# Patient Record
Sex: Female | Born: 1990 | Race: White | Hispanic: No | Marital: Single | Smoking: Never smoker
Health system: Southern US, Community
[De-identification: ages and names within clinical notes are randomized; demographics above are authoritative.]

## PROBLEM LIST (undated history)

## (undated) DIAGNOSIS — F419 Anxiety disorder, unspecified: Secondary | ICD-10-CM

## (undated) DIAGNOSIS — G43909 Migraine, unspecified, not intractable, without status migrainosus: Secondary | ICD-10-CM

## (undated) DIAGNOSIS — R55 Syncope and collapse: Secondary | ICD-10-CM

## (undated) DIAGNOSIS — K219 Gastro-esophageal reflux disease without esophagitis: Secondary | ICD-10-CM

## (undated) DIAGNOSIS — N83209 Unspecified ovarian cyst, unspecified side: Secondary | ICD-10-CM

## (undated) DIAGNOSIS — K589 Irritable bowel syndrome without diarrhea: Secondary | ICD-10-CM

## (undated) HISTORY — DX: Syncope and collapse: R55

## (undated) HISTORY — DX: Irritable bowel syndrome without diarrhea: K58.9

## (undated) HISTORY — DX: Gastro-esophageal reflux disease without esophagitis: K21.9

## (undated) HISTORY — DX: Anxiety disorder, unspecified: F41.9

## (undated) HISTORY — PX: OTHER SURGICAL HISTORY: SHX169

## (undated) HISTORY — DX: Unspecified ovarian cyst, unspecified side: N83.209

## (undated) HISTORY — PX: SURGERY OF LIP: SUR1315

---
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
## 2012-09-17 ENCOUNTER — Other Ambulatory Visit: Payer: Self-pay | Admitting: Obstetrics and Gynecology

## 2013-09-22 ENCOUNTER — Other Ambulatory Visit: Payer: Self-pay | Admitting: Obstetrics and Gynecology

## 2014-09-23 ENCOUNTER — Other Ambulatory Visit: Payer: Self-pay | Admitting: Obstetrics and Gynecology

## 2014-09-24 LAB — CYTOLOGY - PAP

## 2014-09-25 ENCOUNTER — Other Ambulatory Visit: Payer: Self-pay | Admitting: Obstetrics and Gynecology

## 2014-09-25 DIAGNOSIS — R221 Localized swelling, mass and lump, neck: Secondary | ICD-10-CM

## 2014-09-29 ENCOUNTER — Other Ambulatory Visit: Payer: 59

## 2014-10-13 ENCOUNTER — Ambulatory Visit
Admission: RE | Admit: 2014-10-13 | Discharge: 2014-10-13 | Disposition: A | Discharge status: Home / Self Care | Payer: Commercial Managed Care - PPO | Source: Ambulatory Visit | Attending: Obstetrics and Gynecology | Admitting: Obstetrics and Gynecology

## 2014-10-13 DIAGNOSIS — R221 Localized swelling, mass and lump, neck: Secondary | ICD-10-CM

## 2014-11-26 ENCOUNTER — Other Ambulatory Visit: Payer: Self-pay | Admitting: Nurse Practitioner

## 2014-11-26 DIAGNOSIS — R197 Diarrhea, unspecified: Secondary | ICD-10-CM

## 2014-11-26 DIAGNOSIS — R1011 Right upper quadrant pain: Secondary | ICD-10-CM

## 2014-12-08 ENCOUNTER — Other Ambulatory Visit: Payer: Self-pay | Admitting: Nurse Practitioner

## 2014-12-08 ENCOUNTER — Ambulatory Visit
Admission: RE | Admit: 2014-12-08 | Discharge: 2014-12-08 | Disposition: A | Discharge status: Home / Self Care | Payer: Commercial Managed Care - PPO | Source: Ambulatory Visit | Attending: Nurse Practitioner | Admitting: Nurse Practitioner

## 2014-12-08 DIAGNOSIS — R1011 Right upper quadrant pain: Secondary | ICD-10-CM

## 2014-12-08 DIAGNOSIS — R197 Diarrhea, unspecified: Secondary | ICD-10-CM

## 2014-12-25 ENCOUNTER — Other Ambulatory Visit: Payer: Self-pay | Admitting: Endocrinology

## 2014-12-25 DIAGNOSIS — E049 Nontoxic goiter, unspecified: Secondary | ICD-10-CM

## 2015-04-29 ENCOUNTER — Ambulatory Visit
Admission: RE | Admit: 2015-04-29 | Discharge: 2015-04-29 | Disposition: A | Discharge status: Home / Self Care | Payer: Commercial Managed Care - PPO | Source: Ambulatory Visit | Attending: Endocrinology | Admitting: Endocrinology

## 2015-04-29 ENCOUNTER — Other Ambulatory Visit: Payer: Commercial Managed Care - PPO

## 2015-04-29 DIAGNOSIS — E049 Nontoxic goiter, unspecified: Secondary | ICD-10-CM

## 2015-10-21 ENCOUNTER — Other Ambulatory Visit: Payer: Self-pay | Admitting: Obstetrics and Gynecology

## 2015-10-25 LAB — CYTOLOGY - PAP

## 2016-05-23 ENCOUNTER — Ambulatory Visit (HOSPITAL_COMMUNITY)
Admission: EM | Admit: 2016-05-23 | Discharge: 2016-05-23 | Disposition: A | Discharge status: Home / Self Care | Payer: Commercial Managed Care - PPO | Attending: Family Medicine | Admitting: Family Medicine

## 2016-05-23 ENCOUNTER — Encounter (HOSPITAL_COMMUNITY): Payer: Self-pay | Admitting: Family Medicine

## 2016-05-23 DIAGNOSIS — R51 Headache: Secondary | ICD-10-CM

## 2016-05-23 DIAGNOSIS — G43009 Migraine without aura, not intractable, without status migrainosus: Secondary | ICD-10-CM

## 2016-05-23 DIAGNOSIS — R519 Headache, unspecified: Secondary | ICD-10-CM

## 2016-05-23 MED ORDER — DIPHENHYDRAMINE HCL 50 MG/ML IJ SOLN
INTRAMUSCULAR | Status: AC
  Filled 2016-05-23: qty 1

## 2016-05-23 MED ORDER — DEXAMETHASONE SODIUM PHOSPHATE 10 MG/ML IJ SOLN
10.0000 mg | Freq: Once | INTRAMUSCULAR | Status: AC
Start: 2016-05-23 — End: 2016-05-23
  Administered 2016-05-23: 10 mg via INTRAMUSCULAR

## 2016-05-23 MED ORDER — KETOROLAC TROMETHAMINE 60 MG/2ML IM SOLN
INTRAMUSCULAR | Status: AC
  Filled 2016-05-23: qty 2

## 2016-05-23 MED ORDER — ONDANSETRON 4 MG PO TBDP
4.0000 mg | ORAL_TABLET | Freq: Once | ORAL | Status: AC
  Administered 2016-05-23: 4 mg via ORAL

## 2016-05-23 MED ORDER — ONDANSETRON HCL 4 MG PO TABS: 4.0000 mg | ORAL_TABLET | Freq: Four times a day (QID) | ORAL | 0 refills | Status: AC

## 2016-05-23 MED ORDER — DEXAMETHASONE SODIUM PHOSPHATE 10 MG/ML IJ SOLN
INTRAMUSCULAR | Status: AC
  Filled 2016-05-23: qty 1

## 2016-05-23 MED ORDER — DIPHENHYDRAMINE HCL 50 MG/ML IJ SOLN
50.0000 mg | Freq: Once | INTRAMUSCULAR | Status: AC
  Administered 2016-05-23: 50 mg via INTRAMUSCULAR

## 2016-05-23 MED ORDER — KETOROLAC TROMETHAMINE 60 MG/2ML IM SOLN
60.0000 mg | Freq: Once | INTRAMUSCULAR | Status: AC
  Administered 2016-05-23: 60 mg via INTRAMUSCULAR

## 2016-05-23 MED ORDER — ONDANSETRON 4 MG PO TBDP
ORAL_TABLET | ORAL | Status: AC
  Filled 2016-05-23: qty 1

## 2016-05-23 NOTE — ED Triage Notes (Signed)
Pt here for migraine with pain in head and at the base of her skull. sts some nausea. Denies any vision changes.

## 2016-05-23 NOTE — ED Provider Notes (Signed)
CSN: [id number]     Arrival date & time 05/23/16  1940 History   First MD Initiated Contact with Patient 05/23/16 2132     Chief Complaint  Patient presents with  . Migraine   (Consider location/radiation/quality/duration/timing/severity/associated sxs/prior Treatment) 25 year old female with a history of migraine headaches and what sounds like muscle tension headaches presents with headache that started yesterday. It has been constant and unrelenting. She has taken baclofen and ibuprofen without relief. Pain is located in the bitemporal areas as well as the base of the occiput. She states that she used to receive injections of medication to the tender points in the occipital scalp for relief but not recently. Her headache is associated with photophobia and nausea without vomiting. Denies any neurologic symptoms. Denies problems with vision, speech, hearing, swallowing, focal paresthesias or weakness. States this headache is similar to past headaches.      Past Medical History:  Diagnosis Date  . Anxiety   . Asthma   . GERD (gastroesophageal reflux disease)   . IBS (irritable bowel syndrome)   . Ovarian cyst    left  . Vasovagal syncope    Past Surgical History:  Procedure Laterality Date  . SURGERY OF LIP     benign tumor/gland x2  . wrist surgery     right   Family History  Problem Relation Age of Onset  . Hypertension Father   . Hyperlipidemia Father   . Colon polyps Father   . Colon cancer      great grandfather  . Diabetes Paternal Grandfather     paternal grandmother   Social History  Substance Use Topics  . Smoking status: Never Smoker  . Smokeless tobacco: Never Used  . Alcohol use No   OB History    No data available     Review of Systems  Constitutional: Positive for activity change. Negative for fever.  HENT: Negative for congestion, ear discharge, hearing loss, postnasal drip, sinus pressure and sore throat.   Eyes: Positive for photophobia. Negative  for pain and visual disturbance.  Respiratory: Negative.   Cardiovascular: Negative.   Gastrointestinal: Positive for nausea. Negative for vomiting.  Genitourinary: Negative.   Skin: Negative.   Neurological: Positive for headaches. Negative for dizziness, tremors, syncope, facial asymmetry, speech difficulty and light-headedness.  Psychiatric/Behavioral: Negative.   All other systems reviewed and are negative.   Allergies  Review of patient's allergies indicates no known allergies.  Home Medications   Prior to Admission medications   Medication Sig Start Date End Date Taking? Authorizing Provider  ALPRAZolam (XANAX) 0.25 MG tablet Take 0.25 mg by mouth as needed.      Historical Provider, MD  dicyclomine (BENTYL) 20 MG tablet Take 1 tablet (20 mg total) by mouth 2 (two) times daily. 09/22/11 09/21/12  Lafayette Dragon, MD  ondansetron (ZOFRAN) 4 MG tablet Take 1 tablet (4 mg total) by mouth every 6 (six) hours. 05/23/16   Janne Napoleon, NP   Meds Ordered and Administered this Visit   Medications  ketorolac (TORADOL) injection 60 mg (not administered)  dexamethasone (DECADRON) injection 10 mg (not administered)  diphenhydrAMINE (BENADRYL) injection 50 mg (not administered)  ondansetron (ZOFRAN-ODT) disintegrating tablet 4 mg (not administered)    BP 111/78 (BP Location: Right Arm)   Pulse 97   Temp 98.6 F (37 C) (Oral)   Resp 16   SpO2 99%  No data found.   Physical Exam  Urgent Care Course   Clinical Course    Procedures (  including critical care time)  Labs Review Labs Reviewed - No data to display  Imaging Review No results found.   Visual Acuity Review  Right Eye Distance:   Left Eye Distance:   Bilateral Distance:    Right Eye Near:   Left Eye Near:    Bilateral Near:         MDM   1. Migraine without aura and without status migrainosus, not intractable   2. Nonintractable episodic headache, unspecified headache type    Meds ordered this  encounter  Medications  . ketorolac (TORADOL) injection 60 mg  . dexamethasone (DECADRON) injection 10 mg  . diphenhydrAMINE (BENADRYL) injection 50 mg  . ondansetron (ZOFRAN-ODT) disintegrating tablet 4 mg  . ondansetron (ZOFRAN) 4 MG tablet    Sig: Take 1 tablet (4 mg total) by mouth every 6 (six) hours.    Dispense:  12 tablet    Refill:  0    Order Specific Question:   Supervising Provider    Answer:   Billy Fischer [id number]   F/U with PCP or neurologist tomorrow    Janne Napoleon, NP 05/23/16 2148

## 2016-05-29 IMAGING — US US SOFT TISSUE HEAD/NECK
1 series · 14 of 25 positions shown · non-contrast
Comparison: None.

CLINICAL DATA: Right neck mass, nodule

EXAM:
THYROID ULTRASOUND
TECHNIQUE: Ultrasound examination of the thyroid gland and adjacent soft
tissues was performed.

[Series 1: us soft tissue head/neck · 0.06mm/px · 14 of 43 slices shown]
[im 1/43]
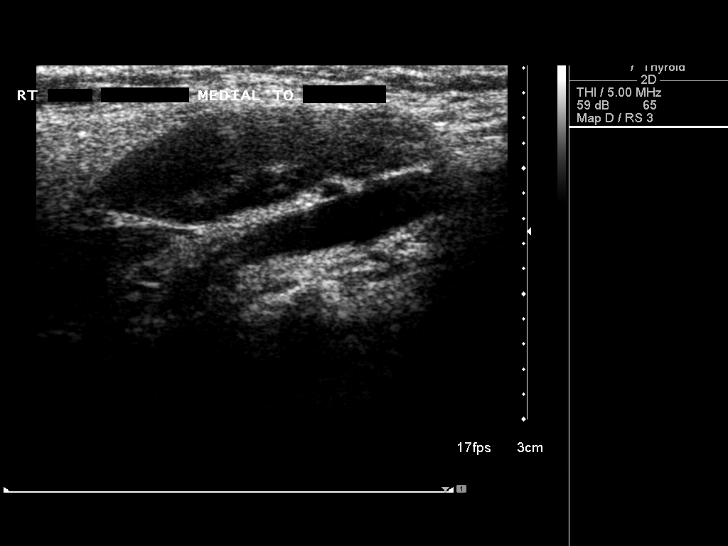
[im 4/43]
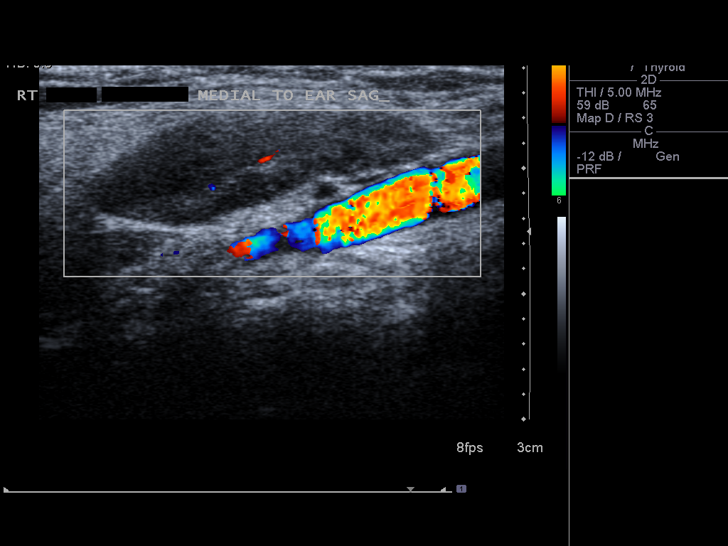
[im 8/43]
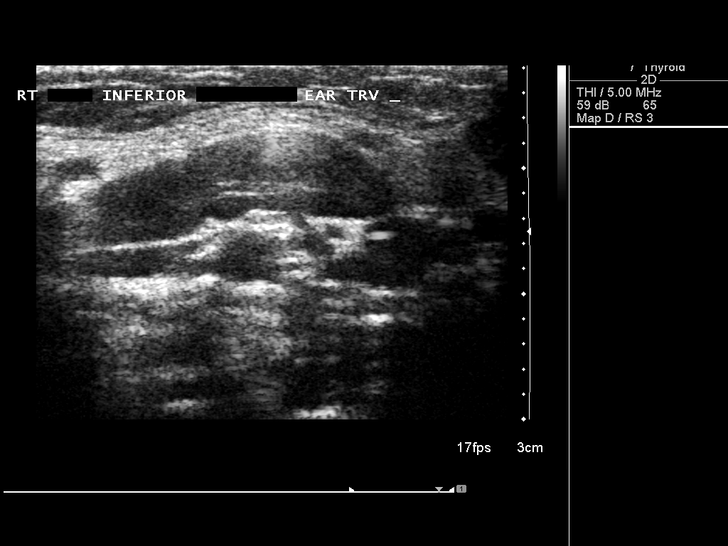
[im 11/43]
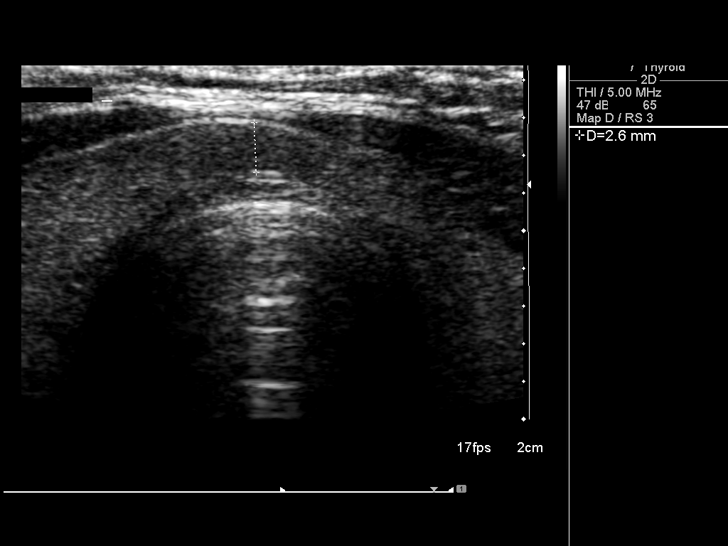
[im 15/43]
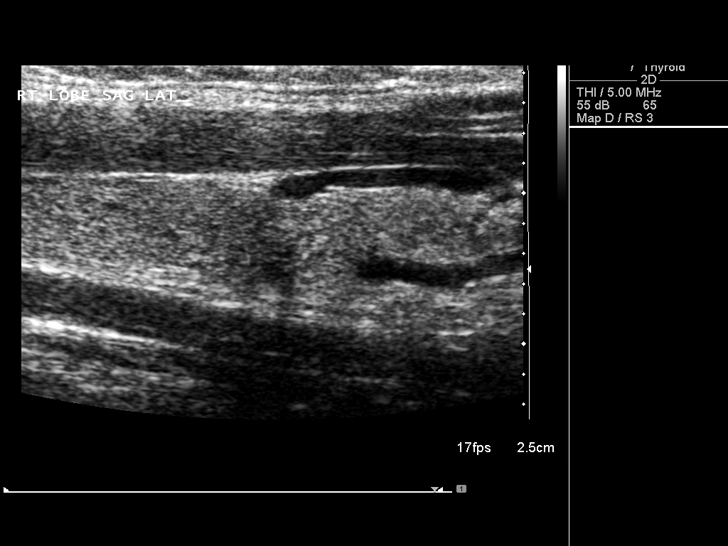
[im 16/43]
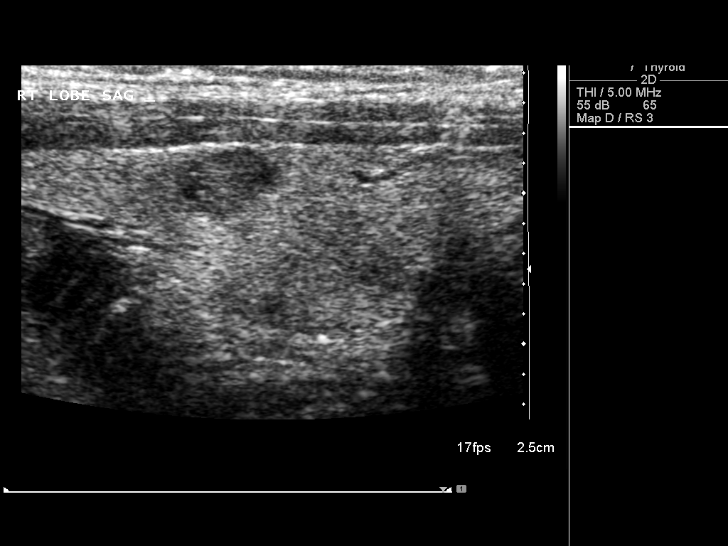
[im 20/43]
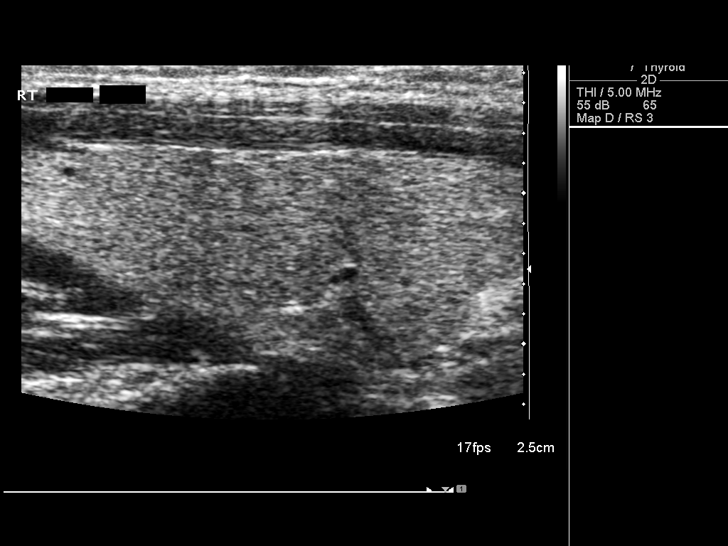
[im 23/43]
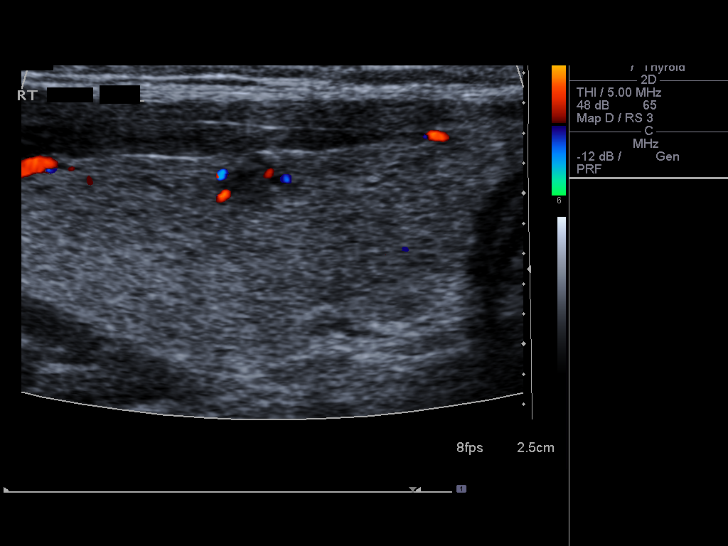
[im 27/43]
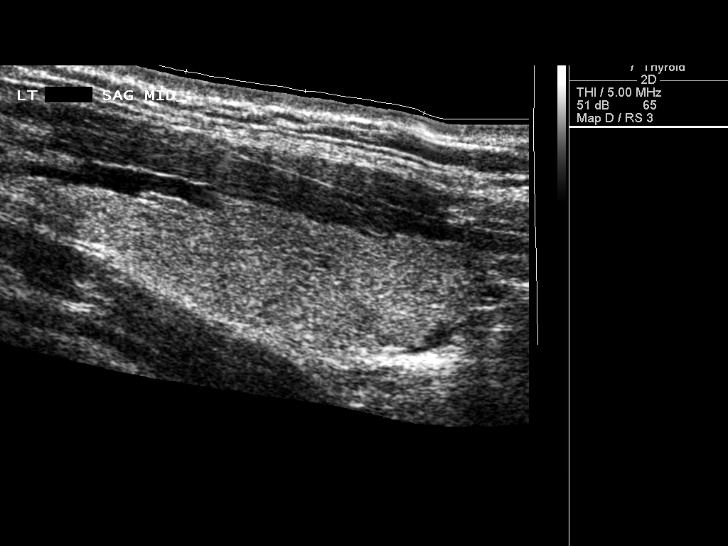
[im 29/43]
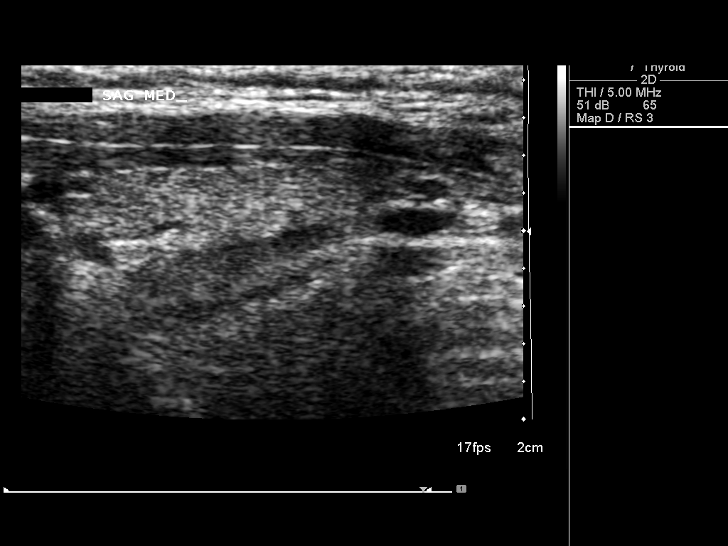
[im 32/43]
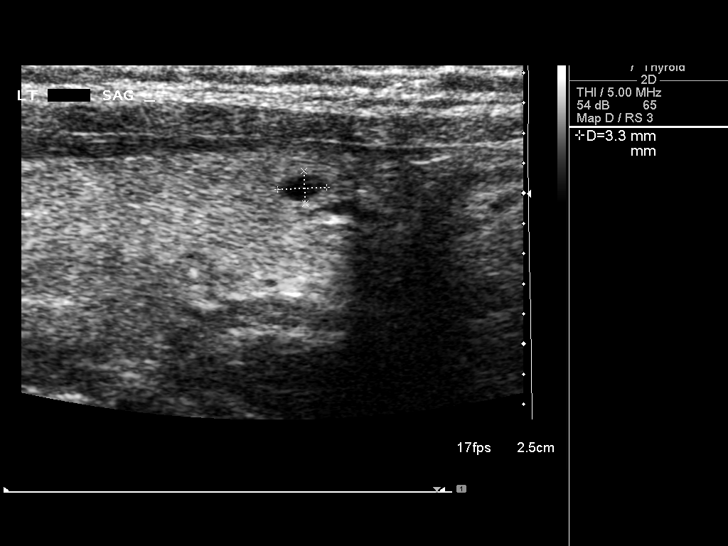
[im 36/43]
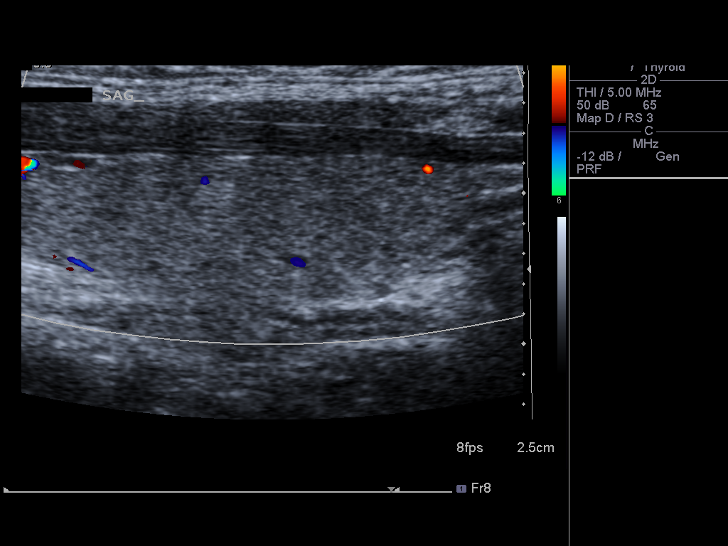
[im 39/43]
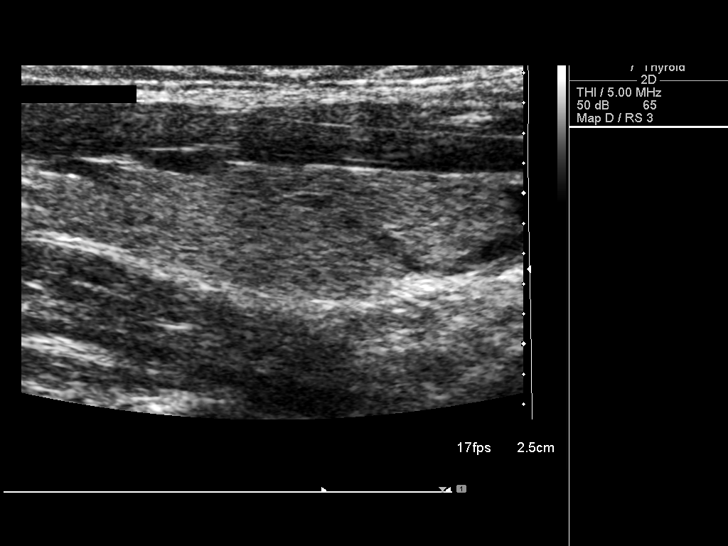
[im 43/43]
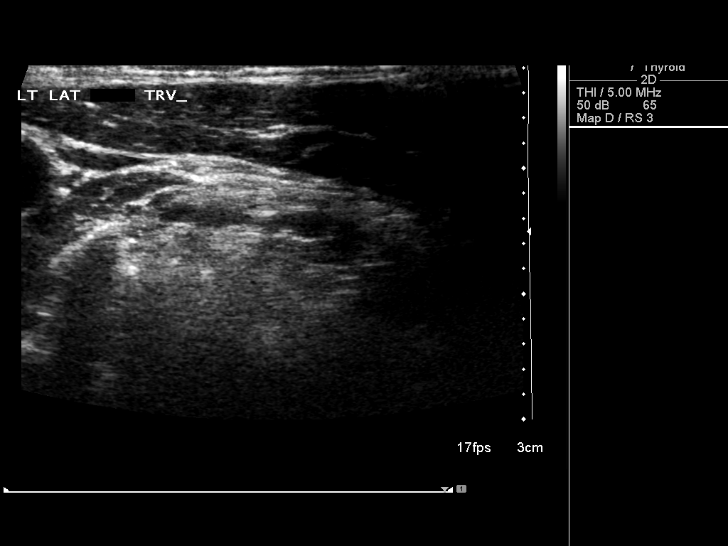

[14 of 25 positions shown; findings below may reference images not displayed]

FINDINGS: Right thyroid lobe

Measurements: 47 x 18 x 15 mm. 6 x 5 mm hypoechoic nodule, mid lobe.

Left thyroid lobe

Measurements: 41 x 11 x 13 mm.  3 mm cyst, inferior pole

Isthmus

Thickness: 3 mm.  No nodules visualized.

Lymphadenopathy

28 x 9 x 25 mm hypoechoic enlarged right infra-auricular lymph node.
IMPRESSION: 1. Normal-sized thyroid with small bilateral lesions. Findings do
not meet current consensus criteria for biopsy. Follow-up by
clinical exam is recommended. If patient has known risk factors for
thyroid carcinoma, consider follow-up ultrasound in 12 months. If
patient is clinically hyperthyroid, consider nuclear medicine
thyroid uptake and scan. This recommendation follows the consensus
statement: Management of Thyroid Nodules Detected as US: Society of
Radiologists in Ultrasound Consensus Conference Statement. Radiology
4882; [DATE].
2. Enlarged right infra-auricular lymph node, possibly reactive but
nonspecific.

## 2016-07-19 ENCOUNTER — Ambulatory Visit (HOSPITAL_COMMUNITY)
Admission: EM | Admit: 2016-07-19 | Discharge: 2016-07-19 | Disposition: A | Discharge status: Home / Self Care | Payer: Commercial Managed Care - PPO | Attending: Physician Assistant | Admitting: Physician Assistant

## 2016-07-19 ENCOUNTER — Encounter (HOSPITAL_COMMUNITY): Payer: Self-pay | Admitting: Emergency Medicine

## 2016-07-19 DIAGNOSIS — G43009 Migraine without aura, not intractable, without status migrainosus: Secondary | ICD-10-CM

## 2016-07-19 HISTORY — DX: Migraine, unspecified, not intractable, without status migrainosus: G43.909

## 2016-07-19 MED ORDER — DEXAMETHASONE SODIUM PHOSPHATE 10 MG/ML IJ SOLN
10.0000 mg | Freq: Once | INTRAMUSCULAR | Status: AC
  Administered 2016-07-19: 10 mg via INTRAMUSCULAR

## 2016-07-19 MED ORDER — KETOROLAC TROMETHAMINE 30 MG/ML IJ SOLN
30.0000 mg | Freq: Once | INTRAMUSCULAR | Status: AC
  Administered 2016-07-19: 30 mg via INTRAMUSCULAR

## 2016-07-19 MED ORDER — DEXAMETHASONE SODIUM PHOSPHATE 10 MG/ML IJ SOLN
INTRAMUSCULAR | Status: AC
  Filled 2016-07-19: qty 1

## 2016-07-19 MED ORDER — DIPHENHYDRAMINE HCL 50 MG/ML IJ SOLN
25.0000 mg | Freq: Once | INTRAMUSCULAR | Status: AC
  Administered 2016-07-19: 25 mg via INTRAMUSCULAR

## 2016-07-19 MED ORDER — KETOROLAC TROMETHAMINE 30 MG/ML IJ SOLN: 30.0000 mg | Freq: Once | INTRAMUSCULAR | Status: DC

## 2016-07-19 MED ORDER — DIPHENHYDRAMINE HCL 50 MG/ML IJ SOLN
INTRAMUSCULAR | Status: AC
  Filled 2016-07-19: qty 1

## 2016-07-19 MED ORDER — SUMATRIPTAN SUCCINATE 50 MG PO TABS: ORAL_TABLET | ORAL | 0 refills | Status: AC

## 2016-07-19 MED ORDER — KETOROLAC TROMETHAMINE 30 MG/ML IJ SOLN
INTRAMUSCULAR | Status: AC
  Filled 2016-07-19: qty 1

## 2016-07-19 NOTE — ED Triage Notes (Signed)
The patient presented to the [facility] with a complaint of a migraine headache that started last night. The patient reported a hx of migraines and stated that she had a practitioner following her for them but she has quit seeing them.

## 2016-07-24 IMAGING — US US ABDOMEN LIMITED
1 series · 14 of 25 positions shown · non-contrast
Comparison: CT 02/10/2011.

CLINICAL DATA: Right upper quadrant pain.

EXAM:
US ABDOMEN LIMITED - RIGHT UPPER QUADRANT

[Series 1: us abdomen limited · 0.22mm/px · 14 of 35 slices shown]
[im 1/35]
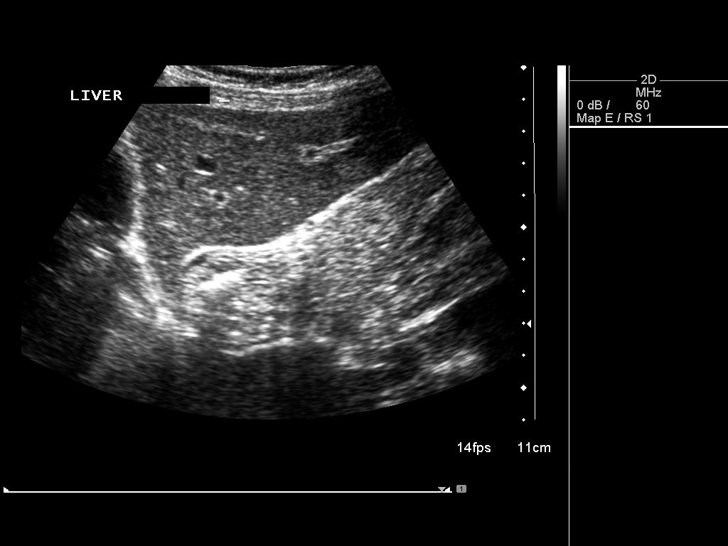
[im 3/35]
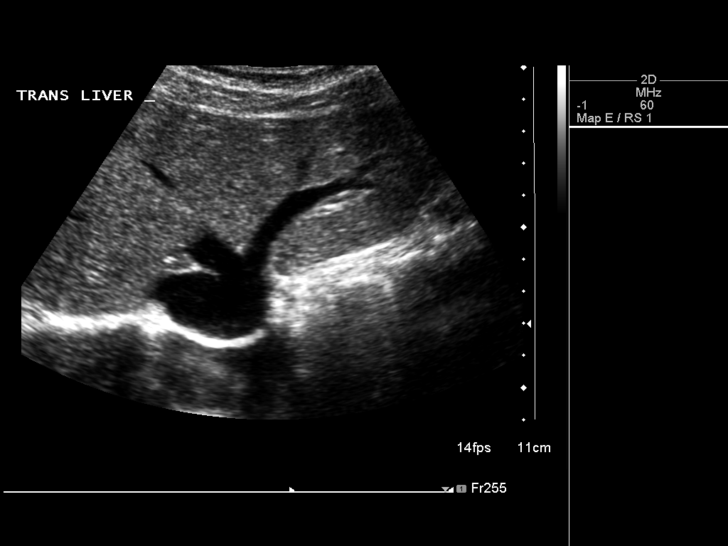
[im 6/35]
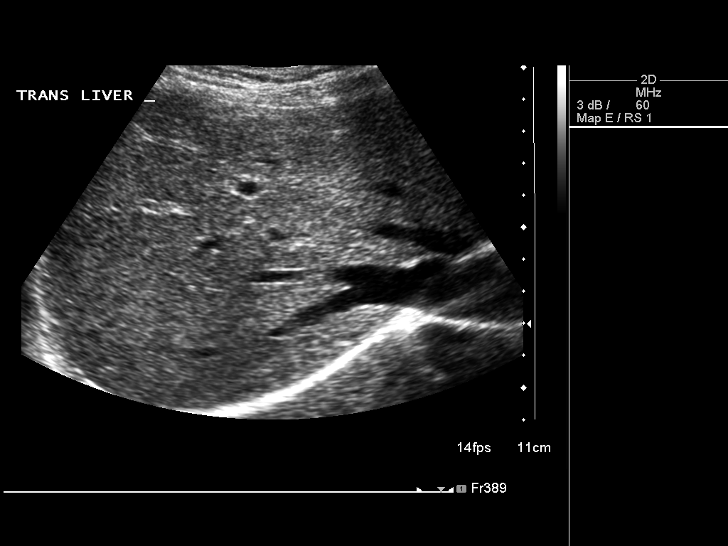
[im 9/35]
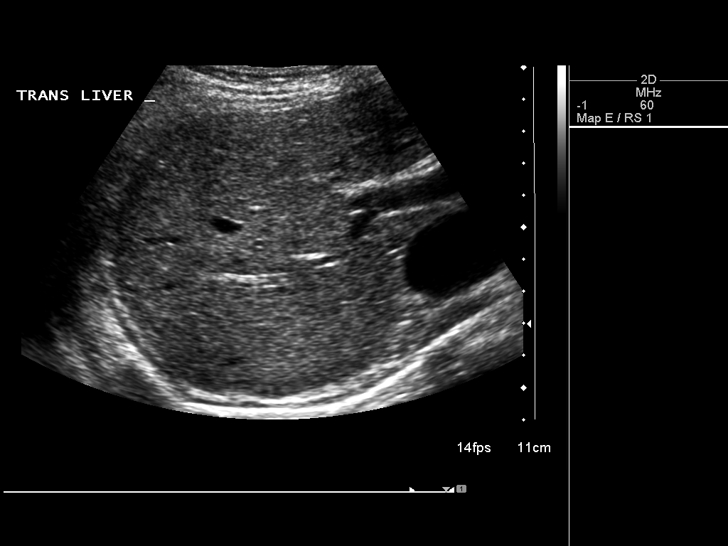
[im 12/35]
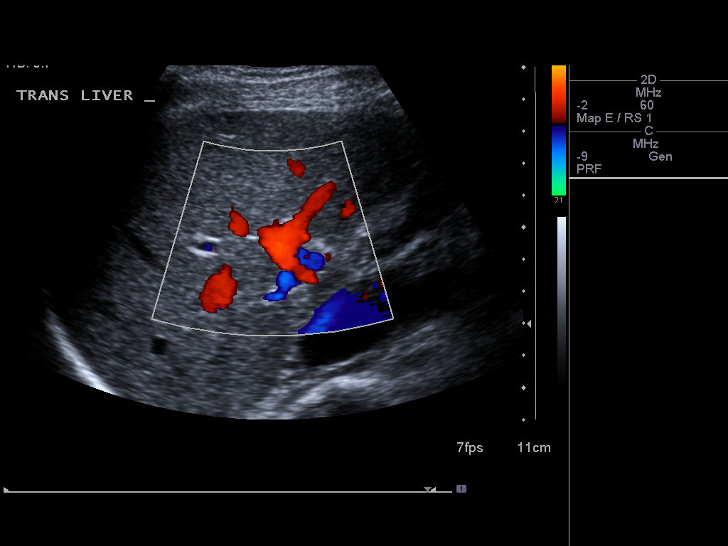
[im 13/35]
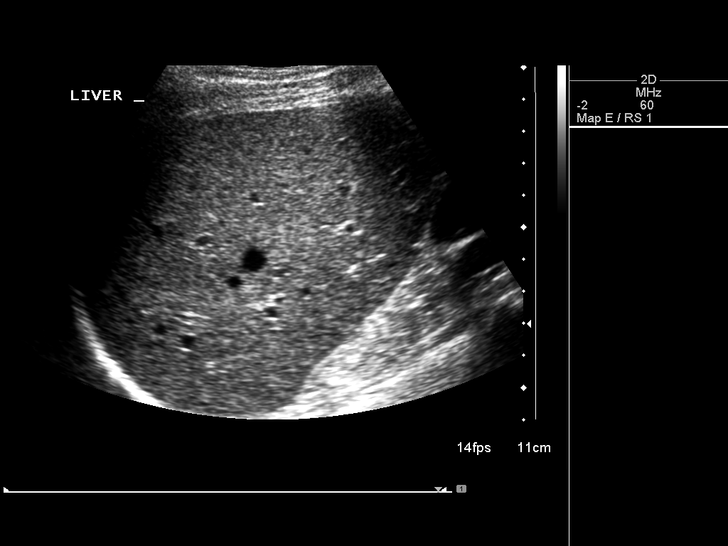
[im 16/35]
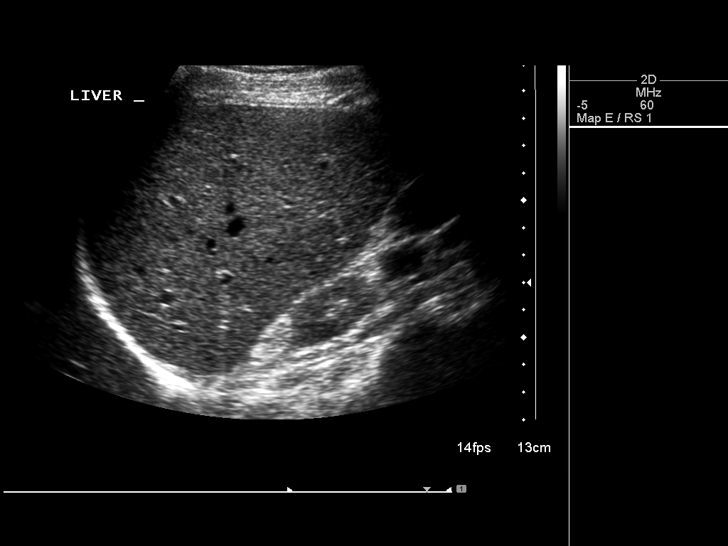
[im 19/35]
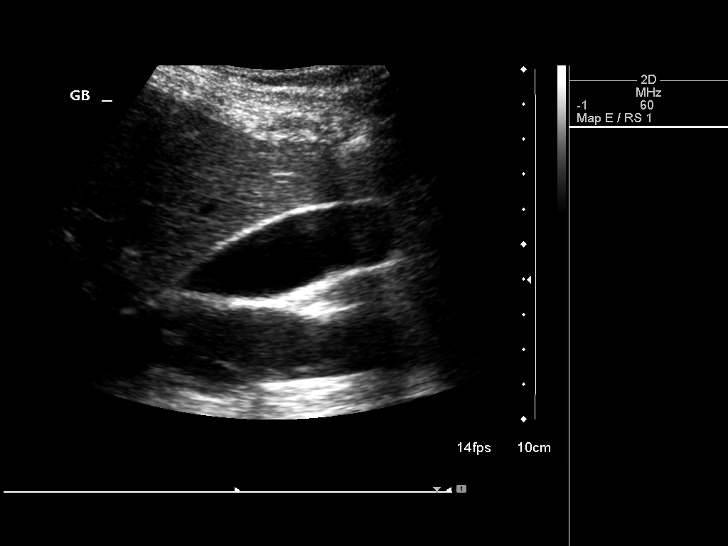
[im 22/35]
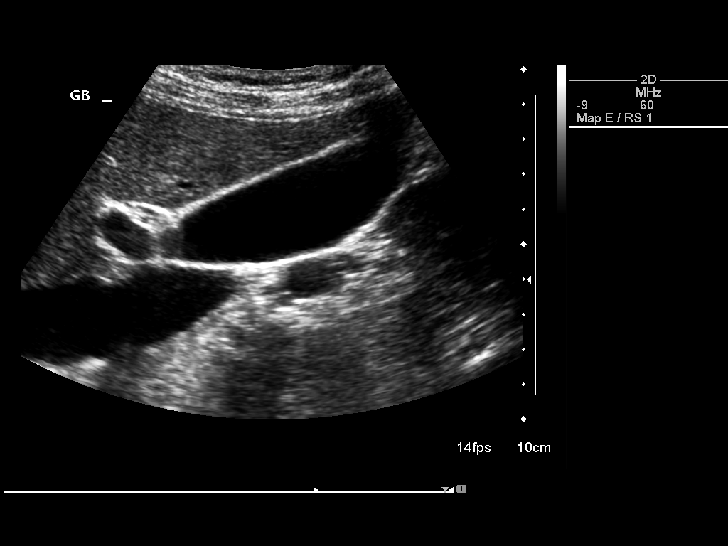
[im 23/35]
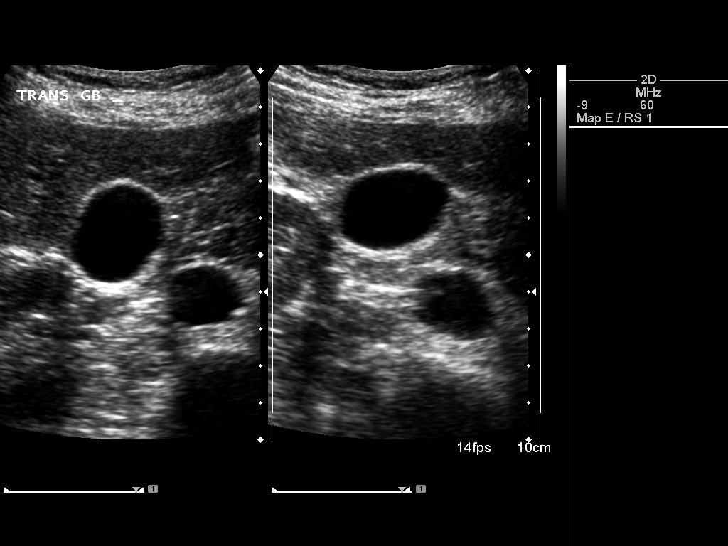
[im 26/35]
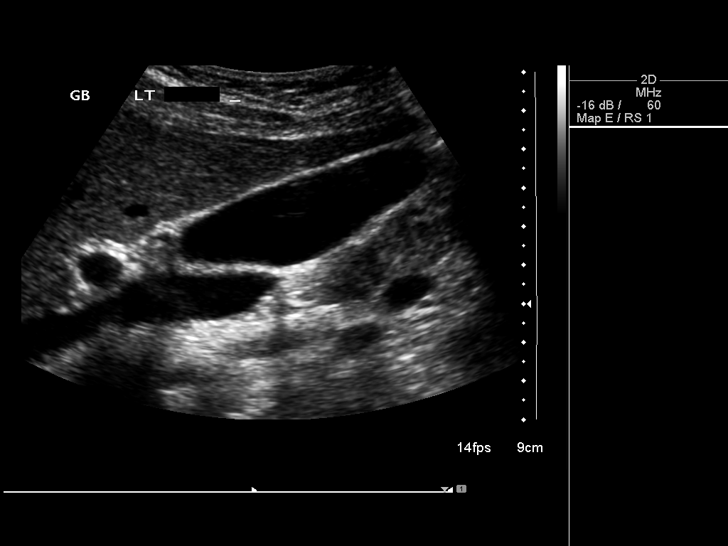
[im 29/35]
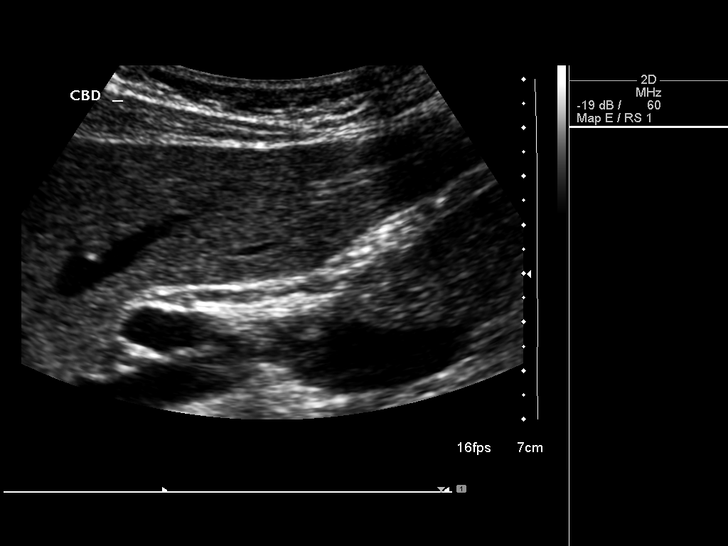
[im 32/35]
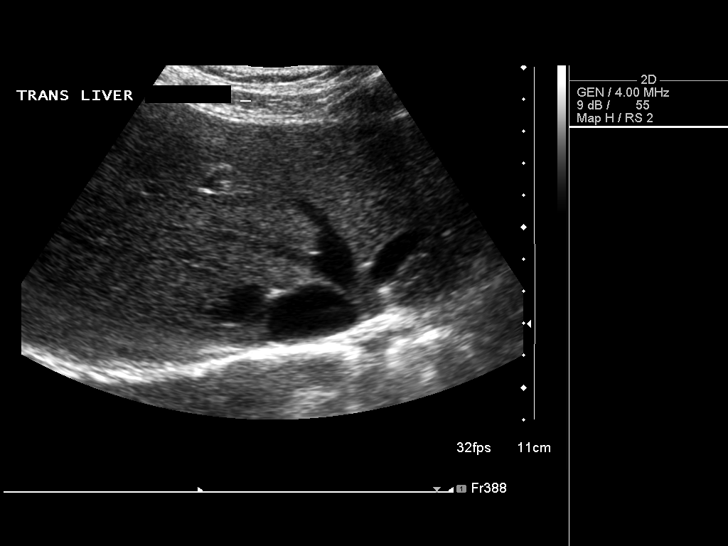
[im 35/35]
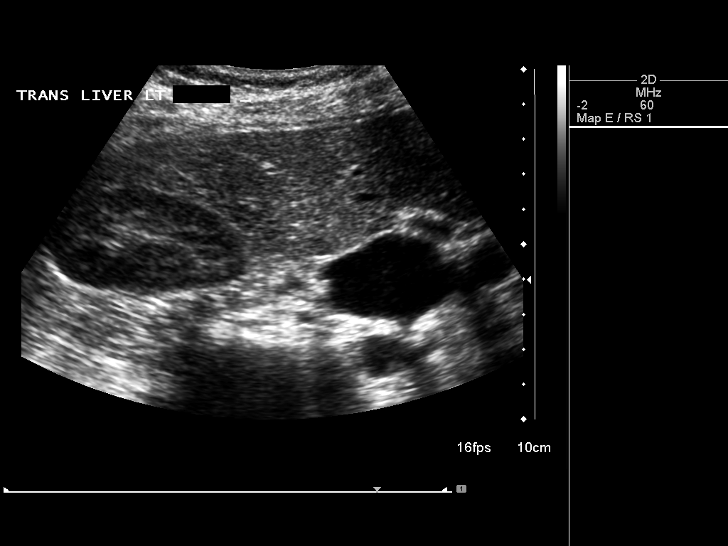

[14 of 25 positions shown; findings below may reference images not displayed]

FINDINGS: Gallbladder:

No gallstones or wall thickening visualized. No sonographic Murphy
sign noted.

Common bile duct:

Diameter: 1.6 mm

Liver:

No focal lesion identified. Within normal limits in parenchymal
echogenicity.
IMPRESSION: No acute abnormality.

## 2016-11-13 ENCOUNTER — Other Ambulatory Visit: Payer: Self-pay | Admitting: Obstetrics and Gynecology

## 2016-11-15 LAB — CYTOLOGY - PAP

## 2020-01-22 ENCOUNTER — Other Ambulatory Visit (HOSPITAL_BASED_OUTPATIENT_CLINIC_OR_DEPARTMENT_OTHER): Payer: Self-pay | Admitting: Primary Care

## 2020-01-22 ENCOUNTER — Ambulatory Visit (HOSPITAL_BASED_OUTPATIENT_CLINIC_OR_DEPARTMENT_OTHER)
Admission: RE | Admit: 2020-01-22 | Discharge: 2020-01-22 | Disposition: A | Discharge status: Home / Self Care | Payer: Managed Care, Other (non HMO) | Source: Ambulatory Visit | Attending: Primary Care | Admitting: Primary Care

## 2020-01-22 ENCOUNTER — Other Ambulatory Visit: Payer: Self-pay

## 2020-01-22 ENCOUNTER — Encounter (HOSPITAL_BASED_OUTPATIENT_CLINIC_OR_DEPARTMENT_OTHER): Payer: Self-pay | Admitting: Radiology

## 2020-01-22 DIAGNOSIS — R102 Pelvic and perineal pain: Secondary | ICD-10-CM | POA: Diagnosis not present

## 2020-02-03 ENCOUNTER — Encounter: Payer: Self-pay | Admitting: Psychiatry

## 2020-02-04 ENCOUNTER — Ambulatory Visit: Payer: Managed Care, Other (non HMO) | Admitting: Nurse Practitioner

## 2021-09-07 IMAGING — US US PELVIS COMPLETE WITH TRANSVAGINAL
1 series · 13 of 25 positions shown · non-contrast
Comparison: 01/06/2011

CLINICAL DATA: Left pelvic pain, rule out ovarian cyst

EXAM:
TRANSABDOMINAL AND TRANSVAGINAL ULTRASOUND OF PELVIS
DOPPLER ULTRASOUND OF OVARIES
TECHNIQUE: Both transabdominal and transvaginal ultrasound examinations of the
pelvis were performed. Transabdominal technique was performed for
global imaging of the pelvis including uterus, ovaries, adnexal
regions, and pelvic cul-de-sac.
It was necessary to proceed with endovaginal exam following the
transabdominal exam to visualize the bilateral ovaries. Color and
duplex Doppler ultrasound was utilized to evaluate blood flow to the
ovaries.

[Series 1: us pelvis complete with transvaginal · 58 acquisitions, 13 frames shown]
[im 1/58]
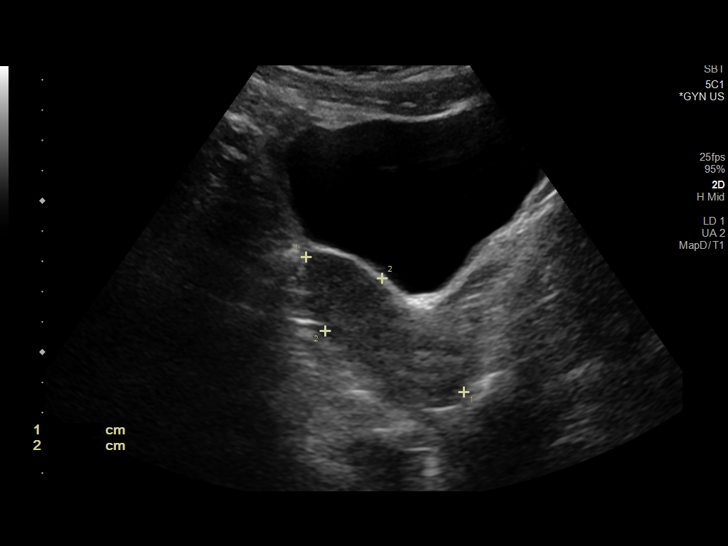
[im 5/58]
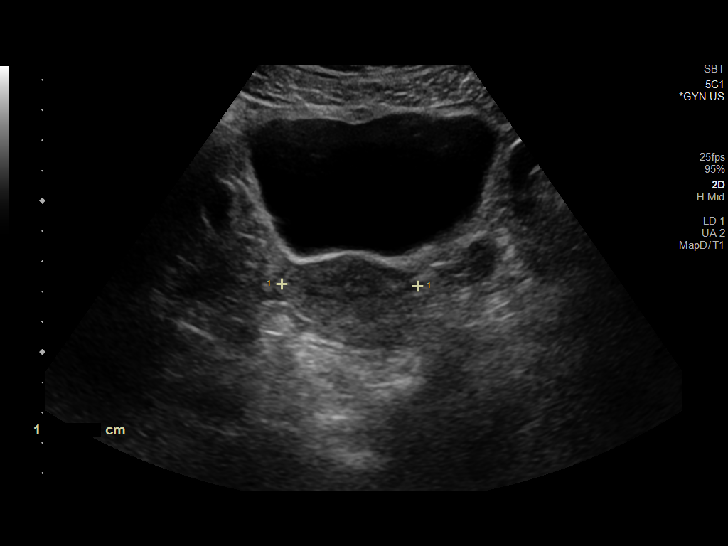
[im 10/58]
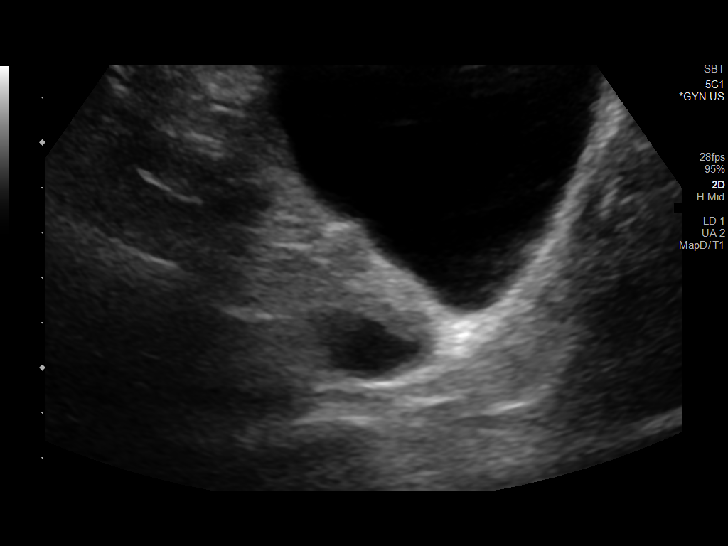
[im 15/58]
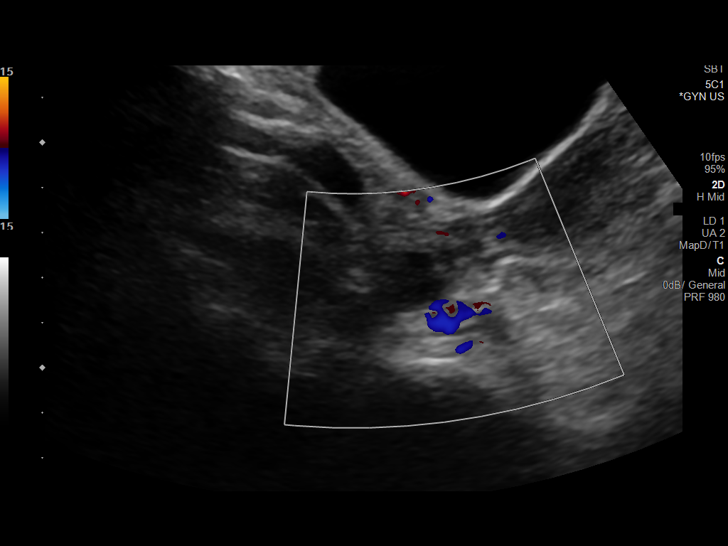
[im 20/58]
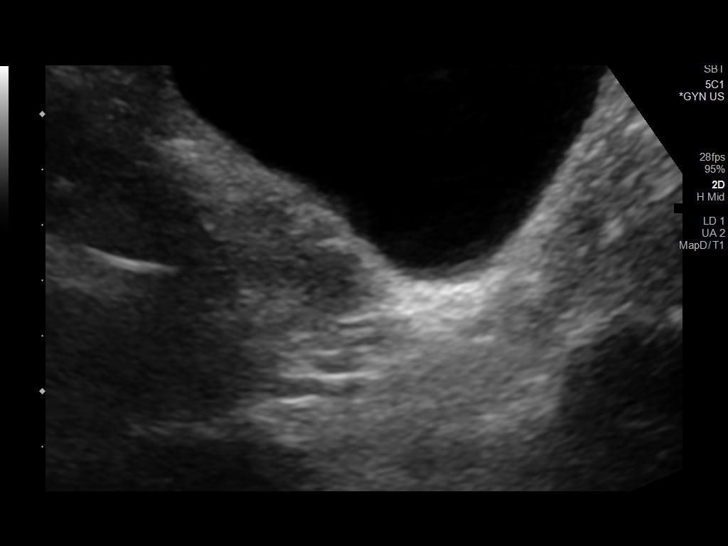
[im 24/58]
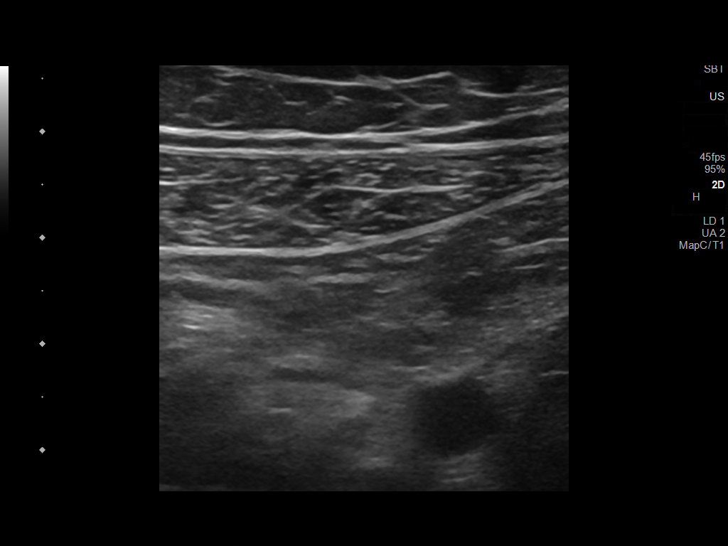
[im 29/58]
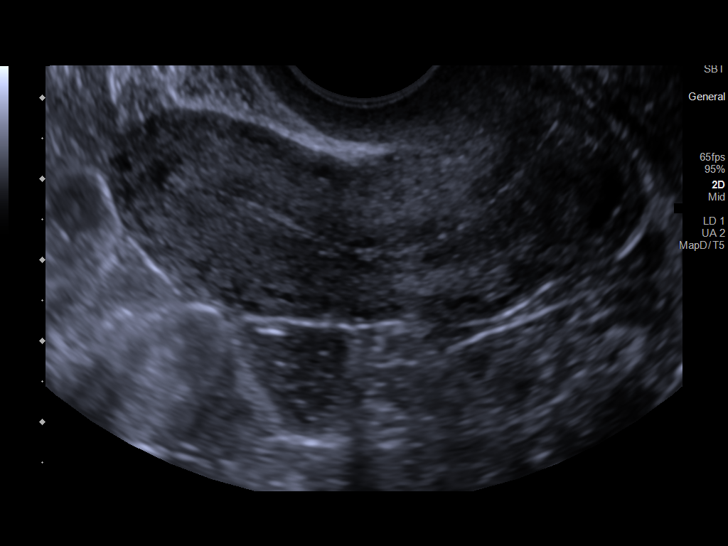
[im 34/58]
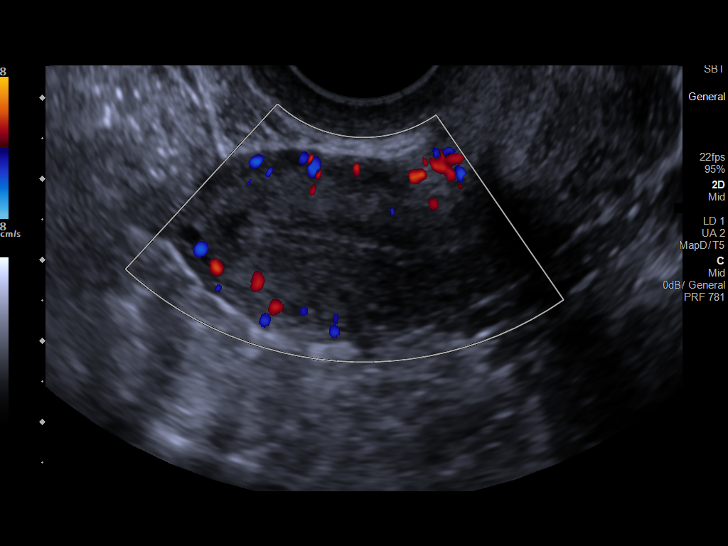
[im 39/58]
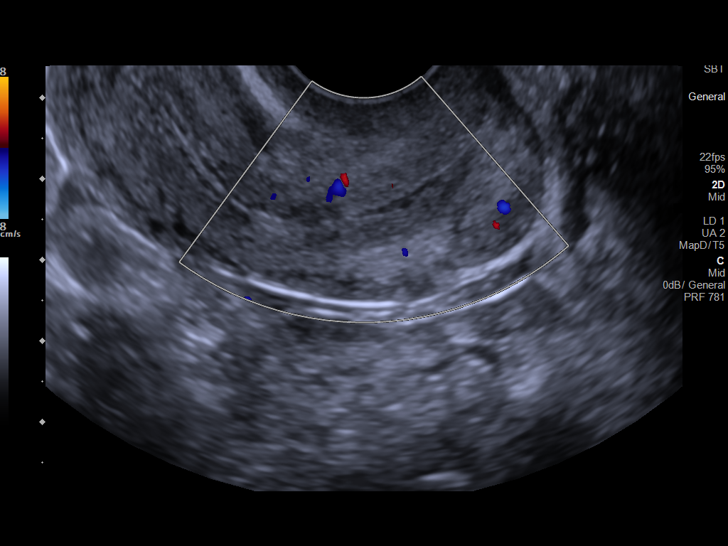
[im 43/58]
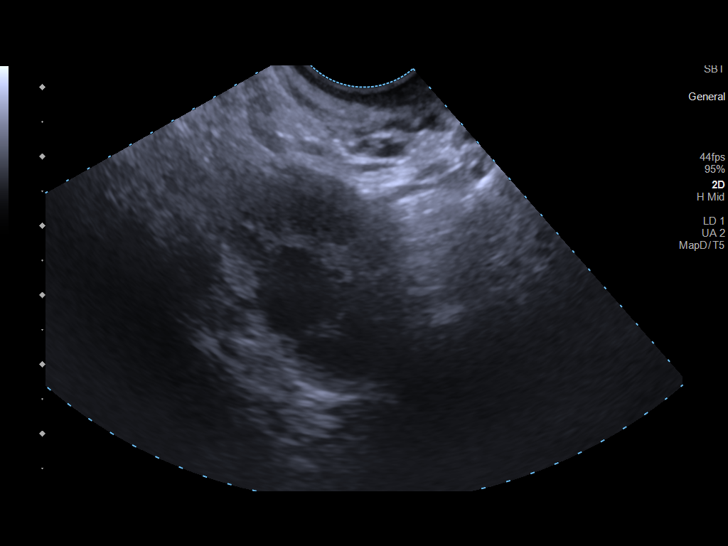
[im 48/58]
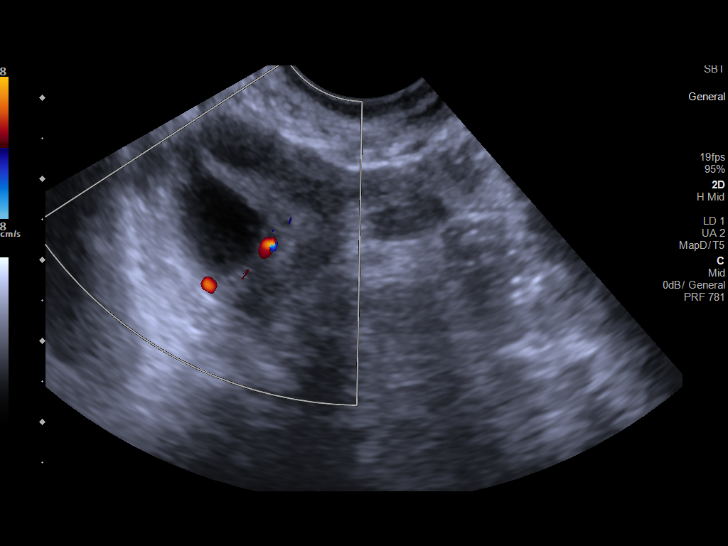
[im 53/58]
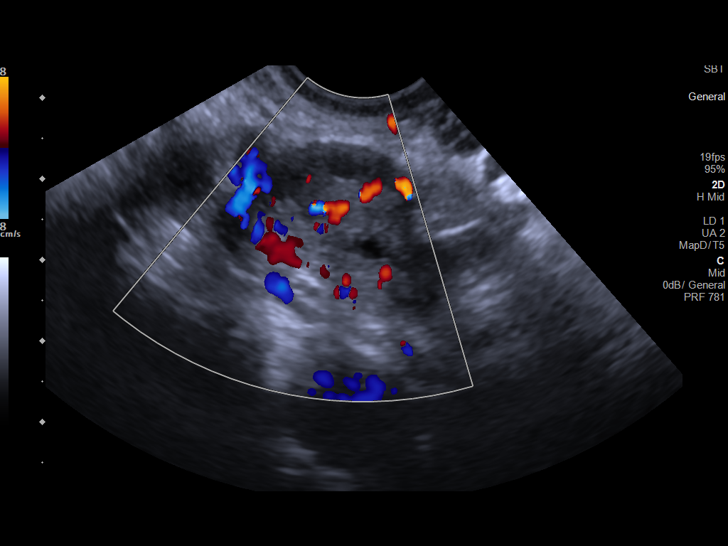
[im 58/58]
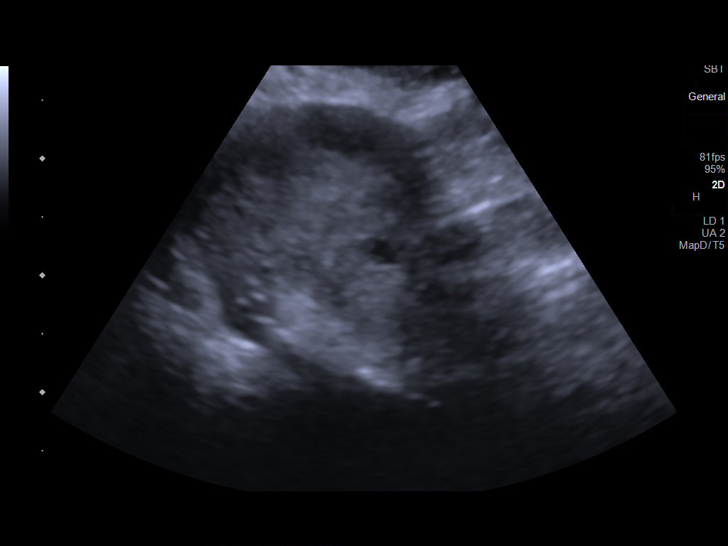

[13 of 25 positions shown; findings below may reference images not displayed]

FINDINGS: Uterus

Measurements: 6.3 x 2.2 x 3.6 cm = volume: 26 mL. No fibroids or
other mass visualized.

Endometrium

Thickness: 4 mm.  No focal abnormality visualized.

Right ovary

Measurements: 4.0 x 2.3 x 2.1 cm = volume: 10 mL. Corpus luteum and
multiple small follicles. Normal appearance/no adnexal mass.

Left ovary

Measurements: 2.8 x 1.6 x 1.6 cm = volume: 4 mL. Multiple small
follicles. Normal appearance/no adnexal mass.

Pulsed Doppler evaluation of both ovaries demonstrates normal
low-resistance arterial and venous waveforms.

Other findings

No abnormal free fluid. There may be a small, fat containing left
inguinal hernia at the patient identified site of pain.
IMPRESSION: 1. No ultrasound abnormality internal to the pelvis to explain
left-sided pelvic pain. Multiple small bilateral follicles and a
corpus luteum of the right ovary.

2. There may be a small, fat containing left inguinal hernia at the
patient identified site of pain, not well evaluated by ultrasound.
